# Patient Record
Sex: Female | Born: 1995 | Hispanic: No | Marital: Married | Smoking: Never smoker
Health system: Southern US, Community
[De-identification: ages and names within clinical notes are randomized; demographics above are authoritative.]

## PROBLEM LIST (undated history)

## (undated) ENCOUNTER — Inpatient Hospital Stay (HOSPITAL_COMMUNITY): Payer: Self-pay

## (undated) DIAGNOSIS — G43909 Migraine, unspecified, not intractable, without status migrainosus: Secondary | ICD-10-CM

---

## 2016-03-28 ENCOUNTER — Encounter (HOSPITAL_BASED_OUTPATIENT_CLINIC_OR_DEPARTMENT_OTHER): Payer: Self-pay | Admitting: *Deleted

## 2016-03-28 ENCOUNTER — Emergency Department (HOSPITAL_BASED_OUTPATIENT_CLINIC_OR_DEPARTMENT_OTHER)
Admission: EM | Admit: 2016-03-28 | Discharge: 2016-03-28 | Disposition: A | Discharge status: Home / Self Care | Payer: BLUE CROSS/BLUE SHIELD | Attending: Emergency Medicine | Admitting: Emergency Medicine

## 2016-03-28 ENCOUNTER — Emergency Department (HOSPITAL_BASED_OUTPATIENT_CLINIC_OR_DEPARTMENT_OTHER): Payer: BLUE CROSS/BLUE SHIELD

## 2016-03-28 DIAGNOSIS — R102 Pelvic and perineal pain: Secondary | ICD-10-CM

## 2016-03-28 DIAGNOSIS — Z79899 Other long term (current) drug therapy: Secondary | ICD-10-CM | POA: Insufficient documentation

## 2016-03-28 DIAGNOSIS — R1030 Lower abdominal pain, unspecified: Secondary | ICD-10-CM | POA: Diagnosis present

## 2016-03-28 DIAGNOSIS — N83201 Unspecified ovarian cyst, right side: Secondary | ICD-10-CM | POA: Diagnosis not present

## 2016-03-28 HISTORY — DX: Migraine, unspecified, not intractable, without status migrainosus: G43.909

## 2016-03-28 LAB — WET PREP, GENITAL
CLUE CELLS WET PREP: NONE SEEN
Sperm: NONE SEEN
TRICH WET PREP: NONE SEEN
Yeast Wet Prep HPF POC: NONE SEEN

## 2016-03-28 LAB — CBC WITH DIFFERENTIAL/PLATELET
BASOS ABS: 0 10*3/uL (ref 0.0–0.1)
BASOS PCT: 0 %
Eosinophils Absolute: 0.4 10*3/uL (ref 0.0–0.7)
Eosinophils Relative: 5 %
HEMATOCRIT: 34.6 % — AB (ref 36.0–46.0)
Hemoglobin: 11.8 g/dL — ABNORMAL LOW (ref 12.0–15.0)
LYMPHS PCT: 34 %
Lymphs Abs: 2.5 10*3/uL (ref 0.7–4.0)
MCH: 29.7 pg (ref 26.0–34.0)
MCHC: 34.1 g/dL (ref 30.0–36.0)
MCV: 87.2 fL (ref 78.0–100.0)
MONO ABS: 0.6 10*3/uL (ref 0.1–1.0)
Monocytes Relative: 8 %
NEUTROS ABS: 4 10*3/uL (ref 1.7–7.7)
NEUTROS PCT: 53 %
Platelets: 350 10*3/uL (ref 150–400)
RBC: 3.97 MIL/uL (ref 3.87–5.11)
RDW: 13.7 % (ref 11.5–15.5)
WBC: 7.6 10*3/uL (ref 4.0–10.5)

## 2016-03-28 LAB — URINE MICROSCOPIC-ADD ON

## 2016-03-28 LAB — URINALYSIS, ROUTINE W REFLEX MICROSCOPIC
Bilirubin Urine: NEGATIVE
Glucose, UA: NEGATIVE mg/dL
HGB URINE DIPSTICK: NEGATIVE
Ketones, ur: NEGATIVE mg/dL
Nitrite: NEGATIVE
PROTEIN: NEGATIVE mg/dL
SPECIFIC GRAVITY, URINE: 1.021 (ref 1.005–1.030)
pH: 6.5 (ref 5.0–8.0)

## 2016-03-28 LAB — BASIC METABOLIC PANEL
ANION GAP: 6 (ref 5–15)
BUN: 16 mg/dL (ref 6–20)
CALCIUM: 8.9 mg/dL (ref 8.9–10.3)
CO2: 25 mmol/L (ref 22–32)
Chloride: 107 mmol/L (ref 101–111)
Creatinine, Ser: 0.62 mg/dL (ref 0.44–1.00)
GLUCOSE: 84 mg/dL (ref 65–99)
POTASSIUM: 3.8 mmol/L (ref 3.5–5.1)
SODIUM: 138 mmol/L (ref 135–145)

## 2016-03-28 LAB — PREGNANCY, URINE: PREG TEST UR: NEGATIVE

## 2016-03-28 MED ORDER — KETOROLAC TROMETHAMINE 30 MG/ML IJ SOLN
30.0000 mg | Freq: Once | INTRAMUSCULAR | Status: AC
  Administered 2016-03-28: 30 mg via INTRAVENOUS
  Filled 2016-03-28: qty 1

## 2016-03-28 NOTE — ED Notes (Signed)
Pt given d/c instructions as per chart. Verbalizes understanding. No questions. 

## 2016-03-28 NOTE — ED Provider Notes (Signed)
[facility] Note   CSN: [id number] Arrival date & time: 03/28/16  1025  First Provider Contact:  First MD Initiated Contact with Patient 03/28/16 1045        History   Chief Complaint Chief Complaint  Patient presents with  . Abdominal Pain    HPI Katrina Sweeney is a 20 y.o. female.  Patient presents with abdominal pain. She is a 20 year old female with a history of migraines. She states at 9:00 this morning she had sudden onset of pain across her lower abdomen. There is associated nausea and dry heaves. She denies any urinary symptoms. No back pain. No vaginal bleeding or discharge. Her last menstrual period was July 2. She states she's had a history of bad menstrual cramps but no pain it's been this bad. She denies any known fevers. No change in bowel habits.  She denies being sexually active.    Abdominal Pain   Associated symptoms include nausea. Pertinent negatives include fever, diarrhea, vomiting, frequency, hematuria, headaches and arthralgias.    Past Medical History:  Diagnosis Date  . Migraine     There are no active problems to display for this patient.   History reviewed. No pertinent surgical history.  OB History    No data available       Home Medications    Prior to Admission medications   Medication Sig Start Date End Date Taking? Authorizing Provider  TIZANIDINE HCL PO Take 2 tablets by mouth at bedtime. Does not know dose.   Yes Historical Provider, MD  Topiramate (TOPAMAX PO) Take 2 tablets by mouth every morning.   Yes Historical Provider, MD    Family History No family history on file.  Social History Social History  Substance Use Topics  . Smoking status: Not on file  . Smokeless tobacco: Not on file  . Alcohol use Not on file     Allergies   Review of patient's allergies indicates no known allergies.   Review of Systems Review of Systems  Constitutional: Negative for chills, diaphoresis, fatigue and  fever.  HENT: Negative for congestion, rhinorrhea and sneezing.   Eyes: Negative.   Respiratory: Negative for cough, chest tightness and shortness of breath.   Cardiovascular: Negative for chest pain and leg swelling.  Gastrointestinal: Positive for abdominal pain and nausea. Negative for blood in stool, diarrhea and vomiting.  Genitourinary: Positive for pelvic pain. Negative for difficulty urinating, flank pain, frequency and hematuria.  Musculoskeletal: Negative for arthralgias and back pain.  Skin: Negative for rash.  Neurological: Negative for dizziness, speech difficulty, weakness, numbness and headaches.     Physical Exam Updated Vital Signs BP 100/59 (BP Location: Right Arm)   Pulse 74   Temp 98.2 F (36.8 C) (Oral)   Resp 18   Ht 5' (1.524 m)   Wt 127 lb (57.6 kg)   LMP 03/01/2016   SpO2 100%   BMI 24.80 kg/m   Physical Exam  Constitutional: She is oriented to person, place, and time. She appears well-developed and well-nourished.  HENT:  Head: Normocephalic and atraumatic.  Eyes: Pupils are equal, round, and reactive to light.  Neck: Normal range of motion. Neck supple.  Cardiovascular: Normal rate, regular rhythm and normal heart sounds.   Pulmonary/Chest: Effort normal and breath sounds normal. No respiratory distress. She has no wheezes. She has no rales. She exhibits no tenderness.  Abdominal: Soft. Bowel sounds are normal. There is tenderness (moderate TTP suprapubic area and across lower abdomen bilaterally). There  is no rebound and no guarding.  Genitourinary: Uterus normal. Cervix exhibits no motion tenderness and no friability. Right adnexum displays tenderness. Vaginal discharge (slight yellow discharge) found.  Musculoskeletal: Normal range of motion. She exhibits no edema.  Lymphadenopathy:    She has no cervical adenopathy.  Neurological: She is alert and oriented to person, place, and time.  Skin: Skin is warm and dry. No rash noted.  Psychiatric: She  has a normal mood and affect.     ED Treatments / Results  Labs (all labs ordered are listed, but only abnormal results are displayed) Labs Reviewed  WET PREP, GENITAL - Abnormal; Notable for the following:       Result Value   WBC, Wet Prep HPF POC MANY (*)    All other components within normal limits  URINALYSIS, ROUTINE W REFLEX MICROSCOPIC (NOT AT [facility]) - Abnormal; Notable for the following:    APPearance CLOUDY (*)    Leukocytes, UA SMALL (*)    All other components within normal limits  CBC WITH DIFFERENTIAL/PLATELET - Abnormal; Notable for the following:    Hemoglobin 11.8 (*)    HCT 34.6 (*)    All other components within normal limits  URINE MICROSCOPIC-ADD ON - Abnormal; Notable for the following:    Squamous Epithelial / LPF 0-5 (*)    Bacteria, UA MANY (*)    All other components within normal limits  PREGNANCY, URINE  BASIC METABOLIC PANEL  GC/CHLAMYDIA PROBE AMP ([place]) NOT AT [facility]    EKG  EKG Interpretation None       Radiology No results found.  Procedures Procedures (including critical care time)  Medications Ordered in ED Medications  ketorolac (TORADOL) 30 MG/ML injection 30 mg (30 mg Intravenous Given 03/28/16 1134)     Initial Impression / Assessment and Plan / ED Course  I have reviewed the triage vital signs and the nursing notes.  Pertinent labs & imaging results that were available during my care of the patient were reviewed by me and considered in my medical decision making (see chart for details).  Clinical Course    Patient presents with lower abdominal pain. On exam, it seems to be more pelvic in nature. She has no strong evidence of infection. She is currently awaiting a pelvic ultrasound.  Dr. Adela Lank to take over care.  Final Clinical Impressions(s) / ED Diagnoses   Final diagnoses:  Pelvic pain in female    New Prescriptions New Prescriptions   No medications on file     Rolan Bucco, MD 03/28/16 1453

## 2016-03-28 NOTE — ED Triage Notes (Signed)
Patient states she developed diffuse abdominal pain this morning around 0900.  Describes pain as constant pressure and sharp, and is associated with nausea and dry heaves.

## 2016-03-28 NOTE — ED Notes (Signed)
Pt placed on automatic vital signs.

## 2016-03-30 LAB — GC/CHLAMYDIA PROBE AMP (~~LOC~~) NOT AT ARMC
CHLAMYDIA, DNA PROBE: NEGATIVE
Neisseria Gonorrhea: NEGATIVE

## 2016-04-15 ENCOUNTER — Ambulatory Visit (INDEPENDENT_AMBULATORY_CARE_PROVIDER_SITE_OTHER): Payer: BLUE CROSS/BLUE SHIELD | Admitting: Obstetrics & Gynecology

## 2016-04-15 ENCOUNTER — Encounter: Payer: Self-pay | Admitting: Obstetrics & Gynecology

## 2016-04-15 VITALS — BP 96/61 | HR 72 | Ht 61.0 in | Wt 125.0 lb

## 2016-04-15 DIAGNOSIS — N83209 Unspecified ovarian cyst, unspecified side: Secondary | ICD-10-CM | POA: Diagnosis not present

## 2016-04-15 DIAGNOSIS — R102 Pelvic and perineal pain: Secondary | ICD-10-CM | POA: Diagnosis not present

## 2016-04-15 MED ORDER — IBUPROFEN 800 MG PO TABS: 800.0000 mg | ORAL_TABLET | Freq: Three times a day (TID) | ORAL | 1 refills | Status: DC | PRN

## 2016-04-15 NOTE — Patient Instructions (Signed)
Ovarian Cyst An ovarian cyst is a fluid-filled sac that forms on an ovary. The ovaries are small organs that produce eggs in women. Various types of cysts can form on the ovaries. Most are not cancerous. Many do not cause problems, and they often go away on their own. Some may cause symptoms and require treatment. Common types of ovarian cysts include:  Functional cysts--These cysts may occur every month during the menstrual cycle. This is normal. The cysts usually go away with the next menstrual cycle if the woman does not get pregnant. Usually, there are no symptoms with a functional cyst.  Endometrioma cysts--These cysts form from the tissue that lines the uterus. They are also called "chocolate cysts" because they become filled with blood that turns brown. This type of cyst can cause pain in the lower abdomen during intercourse and with your menstrual period.  Cystadenoma cysts--This type develops from the cells on the outside of the ovary. These cysts can get very big and cause lower abdomen pain and pain with intercourse. This type of cyst can twist on itself, cut off its blood supply, and cause severe pain. It can also easily rupture and cause a lot of pain.  Dermoid cysts--This type of cyst is sometimes found in both ovaries. These cysts may contain different kinds of body tissue, such as skin, teeth, hair, or cartilage. They usually do not cause symptoms unless they get very big.  Theca lutein cysts--These cysts occur when too much of a certain hormone (human chorionic gonadotropin) is produced and overstimulates the ovaries to produce an egg. This is most common after procedures used to assist with the conception of a baby (in vitro fertilization). CAUSES   Fertility drugs can cause a condition in which multiple large cysts are formed on the ovaries. This is called ovarian hyperstimulation syndrome.  A condition called polycystic ovary syndrome can cause hormonal imbalances that can lead to  nonfunctional ovarian cysts. SIGNS AND SYMPTOMS  Many ovarian cysts do not cause symptoms. If symptoms are present, they may include:  Pelvic pain or pressure.  Pain in the lower abdomen.  Pain during sexual intercourse.  Increasing girth (swelling) of the abdomen.  Abnormal menstrual periods.  Increasing pain with menstrual periods.  Stopping having menstrual periods without being pregnant. DIAGNOSIS  These cysts are commonly found during a routine or annual pelvic exam. Tests may be ordered to find out more about the cyst. These tests may include:  Ultrasound.  X-ray of the pelvis.  CT scan.  MRI.  Blood tests. TREATMENT  Many ovarian cysts go away on their own without treatment. Your health care provider may want to check your cyst regularly for 2-3 months to see if it changes. For women in menopause, it is particularly important to monitor a cyst closely because of the higher rate of ovarian cancer in menopausal women. When treatment is needed, it may include any of the following:  A procedure to drain the cyst (aspiration). This may be done using a long needle and ultrasound. It can also be done through a laparoscopic procedure. This involves using a thin, lighted tube with a tiny camera on the end (laparoscope) inserted through a small incision.  Surgery to remove the whole cyst. This may be done using laparoscopic surgery or an open surgery involving a larger incision in the lower abdomen.  Hormone treatment or birth control pills. These methods are sometimes used to help dissolve a cyst. HOME CARE INSTRUCTIONS   Only take over-the-counter   or prescription medicines as directed by your health care provider.  Follow up with your health care provider as directed.  Get regular pelvic exams and Pap tests. SEEK MEDICAL CARE IF:   Your periods are late, irregular, or painful, or they stop.  Your pelvic pain or abdominal pain does not go away.  Your abdomen becomes  larger or swollen.  You have pressure on your bladder or trouble emptying your bladder completely.  You have pain during sexual intercourse.  You have feelings of fullness, pressure, or discomfort in your stomach.  You lose weight for no apparent reason.  You feel generally ill.  You become constipated.  You lose your appetite.  You develop acne.  You have an increase in body and facial hair.  You are gaining weight, without changing your exercise and eating habits.  You think you are pregnant. SEEK IMMEDIATE MEDICAL CARE IF:   You have increasing abdominal pain.  You feel sick to your stomach (nauseous), and you throw up (vomit).  You develop a fever that comes on suddenly.  You have abdominal pain during a bowel movement.  Your menstrual periods become heavier than usual. MAKE SURE YOU:  Understand these instructions.  Will watch your condition.  Will get help right away if you are not doing well or get worse.   This information is not intended to replace advice given to you by your health care provider. Make sure you discuss any questions you have with your health care provider.   Document Released: 08/17/2005 Document Revised: 08/22/2013 Document Reviewed: 04/24/2013 Elsevier Interactive Patient Education 2016 Elsevier Inc. Dysmenorrhea Menstrual cramps (dysmenorrhea) are caused by the muscles of the uterus tightening (contracting) during a menstrual period. For some women, this discomfort is merely bothersome. For others, dysmenorrhea can be severe enough to interfere with everyday activities for a few days each month. Primary dysmenorrhea is menstrual cramps that last a couple of days when you start having menstrual periods or soon after. This often begins after a teenager starts having her period. As a woman gets older or has a baby, the cramps will usually lessen or disappear. Secondary dysmenorrhea begins later in life, lasts longer, and the pain may be  stronger than primary dysmenorrhea. The pain may start before the period and last a few days after the period.  CAUSES  Dysmenorrhea is usually caused by an underlying problem, such as:  The tissue lining the uterus grows outside of the uterus in other areas of the body (endometriosis).  The endometrial tissue, which normally lines the uterus, is found in or grows into the muscular walls of the uterus (adenomyosis).  The pelvic blood vessels are engorged with blood just before the menstrual period (pelvic congestive syndrome).  Overgrowth of cells (polyps) in the lining of the uterus or cervix.  Falling down of the uterus (prolapse) because of loose or stretched ligaments.  Depression.  Bladder problems, infection, or inflammation.  Problems with the intestine, a tumor, or irritable bowel syndrome.  Cancer of the female organs or bladder.  A severely tipped uterus.  A very tight opening or closed cervix.  Noncancerous tumors of the uterus (fibroids).  Pelvic inflammatory disease (PID).  Pelvic scarring (adhesions) from a previous surgery.  Ovarian cyst.  An intrauterine device (IUD) used for birth control. RISK FACTORS You may be at greater risk of dysmenorrhea if:  You are younger than age 22.  You started puberty early.  You have irregular or heavy bleeding.  You have  never given birth.  You have a family history of this problem.  You are a smoker. SIGNS AND SYMPTOMS   Cramping or throbbing pain in your lower abdomen.  Headaches.  Lower back pain.  Nausea or vomiting.  Diarrhea.  Sweating or dizziness.  Loose stools. DIAGNOSIS  A diagnosis is based on your history, symptoms, physical exam, diagnostic tests, or procedures. Diagnostic tests or procedures may include:  Blood tests.  Ultrasonography.  An examination of the lining of the uterus (dilation and curettage, D&C).  An examination inside your abdomen or pelvis with a scope  (laparoscopy).  X-rays.  CT scan.  MRI.  An examination inside the bladder with a scope (cystoscopy).  An examination inside the intestine or stomach with a scope (colonoscopy, gastroscopy). TREATMENT  Treatment depends on the cause of the dysmenorrhea. Treatment may include:  Pain medicine prescribed by your health care provider.  Birth control pills or an IUD with progesterone hormone in it.  Hormone replacement therapy.  Nonsteroidal anti-inflammatory drugs (NSAIDs). These may help stop the production of prostaglandins.  Surgery to remove adhesions, endometriosis, ovarian cyst, or fibroids.  Removal of the uterus (hysterectomy).  Progesterone shots to stop the menstrual period.  Cutting the nerves on the sacrum that go to the female organs (presacral neurectomy).  Electric current to the sacral nerves (sacral nerve stimulation).  Antidepressant medicine.  Psychiatric therapy, counseling, or group therapy.  Exercise and physical therapy.  Meditation and yoga therapy.  Acupuncture. HOME CARE INSTRUCTIONS   Only take over-the-counter or prescription medicines as directed by your health care provider.  Place a heating pad or hot water bottle on your lower back or abdomen. Do not sleep with the heating pad.  Use aerobic exercises, walking, swimming, biking, and other exercises to help lessen the cramping.  Massage to the lower back or abdomen may help.  Stop smoking.  Avoid alcohol and caffeine. SEEK MEDICAL CARE IF:   Your pain does not get better with medicine.  You have pain with sexual intercourse.  Your pain increases and is not controlled with medicines.  You have abnormal vaginal bleeding with your period.  You develop nausea or vomiting with your period that is not controlled with medicine. SEEK IMMEDIATE MEDICAL CARE IF:  You pass out.    This information is not intended to replace advice given to you by your health care provider. Make sure  you discuss any questions you have with your health care provider.   Document Released: 08/17/2005 Document Revised: 04/19/2013 Document Reviewed: 02/02/2013 Elsevier Interactive Patient Education [employer]2016 Elsevier Inc.

## 2016-04-15 NOTE — Progress Notes (Signed)
History:  20 y.o. G0P0000 here today for pain in her pelvis.  Pt reports significant dysmenorrhea but this was different. She reports that the pain has persisted but, it has been mild. She has been taking Advil with some relief of the pain. Never been sexually active. Pt denies discharge.  Pt reports menses q 1-2 months.   The following portions of the patient's history were reviewed and updated as appropriate: allergies, current medications, past family history, past medical history, past social history, past surgical history and problem list.  Meds: all for migraines.see med list   Review of Systems:  Pertinent items are noted in HPI.  Objective:  Physical Exam Blood pressure 96/61, pulse 72, height 5\' 1"  (1.549 m), weight 125 lb (56.7 kg), last menstrual period 03/06/2016. Gen: NAD Abd: Soft, nontender and nondistended Pelvic: Normal appearing external genitalia; normal appearing vaginal mucosa and cervix.  Normal discharge.  Small uterus, no other palpable masses, no uterine or adnexal tenderness  Labs and Imaging [place] Pelvis Complete  Result Date: 03/28/2016 CLINICAL DATA:  Pelvic pain.  No chance of pregnancy. EXAM: TRANSABDOMINAL ULTRASOUND OF PELVIS DOPPLER ULTRASOUND OF OVARIES TECHNIQUE: Transabdominal ultrasound examination of the pelvis was performed including evaluation of the uterus, ovaries, adnexal regions, and pelvic cul-de-sac. Color and duplex Doppler ultrasound was utilized to evaluate blood flow to the ovaries. COMPARISON:  None. FINDINGS: Uterus Measurements: 7.6 x 3.4 x 4.8 cm. No fibroids or other mass visualized. Endometrium Thickness: 18 mm. No focal abnormality visualized. Right ovary Measurements: 2.7 x 1.8 x 2.9 cm. Normal appearance/no adnexal mass. Left ovary Measurements: 7.5 x 3.5 x 3.8 cm. It contains a complex cystic structure with low level internal echoes measuring 3.2 x 2.4 x 2.5 cm. Pulsed Doppler evaluation demonstrates normal low-resistance arterial and  venous waveforms in both ovaries. IMPRESSION: Normal appearance of the right ovary and uterus. Probable 3.2 cm hemorrhagic cyst on the left ovary. Follow-up in 6-8 weeks may be considered to assure resolution. Electronically Signed   By: Ted Mcalpineobrinka  Dimitrova M.D.   On: 03/28/2016 16:08  [place] Art/ven Flow Abd Pelv Doppler  Result Date: 03/28/2016 CLINICAL DATA:  Pelvic pain.  No chance of pregnancy. EXAM: TRANSABDOMINAL ULTRASOUND OF PELVIS DOPPLER ULTRASOUND OF OVARIES TECHNIQUE: Transabdominal ultrasound examination of the pelvis was performed including evaluation of the uterus, ovaries, adnexal regions, and pelvic cul-de-sac. Color and duplex Doppler ultrasound was utilized to evaluate blood flow to the ovaries. COMPARISON:  None. FINDINGS: Uterus Measurements: 7.6 x 3.4 x 4.8 cm. No fibroids or other mass visualized. Endometrium Thickness: 18 mm. No focal abnormality visualized. Right ovary Measurements: 2.7 x 1.8 x 2.9 cm. Normal appearance/no adnexal mass. Left ovary Measurements: 7.5 x 3.5 x 3.8 cm. It contains a complex cystic structure with low level internal echoes measuring 3.2 x 2.4 x 2.5 cm. Pulsed Doppler evaluation demonstrates normal low-resistance arterial and venous waveforms in both ovaries. IMPRESSION: Normal appearance of the right ovary and uterus. Probable 3.2 cm hemorrhagic cyst on the left ovary. Follow-up in 6-8 weeks may be considered to assure resolution. Electronically Signed   By: Ted Mcalpineobrinka  Dimitrova M.D.   On: 03/28/2016 16:08   Assessment & Plan:  Dysmenorrhea  Primary- pt wants to use NSAIDS for now. Reviewed how to take.  Ov cyst- suspect hemorrhagic cyst.  Rec f/u son is 3 months   Pt to f/u in 3 months or sooner prn Pt is starting college and will f/u for her sono and appt upon here return during the [place]  break  Katrina Sweeney, M.D., [facility]AC[facility]

## 2016-12-07 IMAGING — US US PELVIS COMPLETE
1 series · 14 of 25 positions shown · non-contrast
Comparison: None.

CLINICAL DATA: Pelvic pain.  No chance of pregnancy.

EXAM:
TRANSABDOMINAL ULTRASOUND OF PELVIS
DOPPLER ULTRASOUND OF OVARIES
TECHNIQUE: Transabdominal ultrasound examination of the pelvis was performed
including evaluation of the uterus, ovaries, adnexal regions, and
pelvic cul-de-sac.
Color and duplex Doppler ultrasound was utilized to evaluate blood
flow to the ovaries.

[Series 1: us pelvis complete · 0.22mm/px · 14 of 56 slices shown]
[im 1/56]
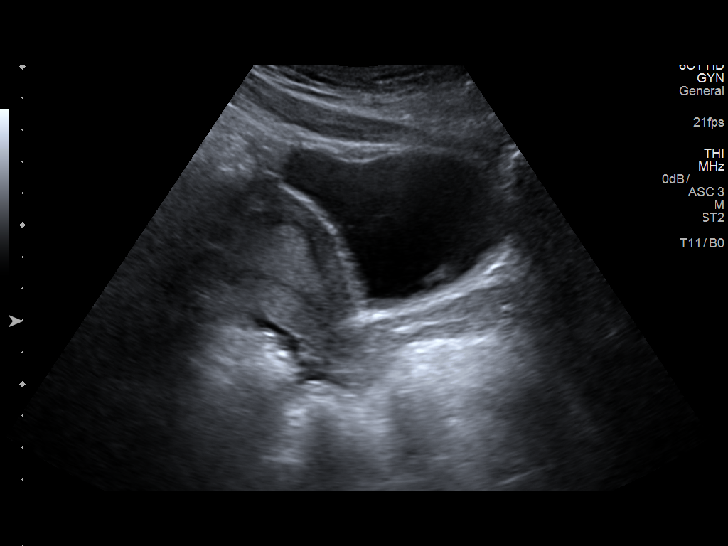
[im 5/56]
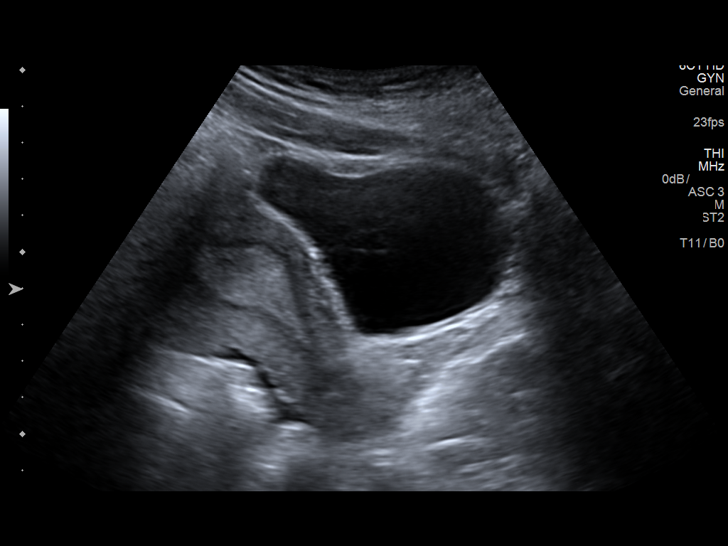
[im 10/56]
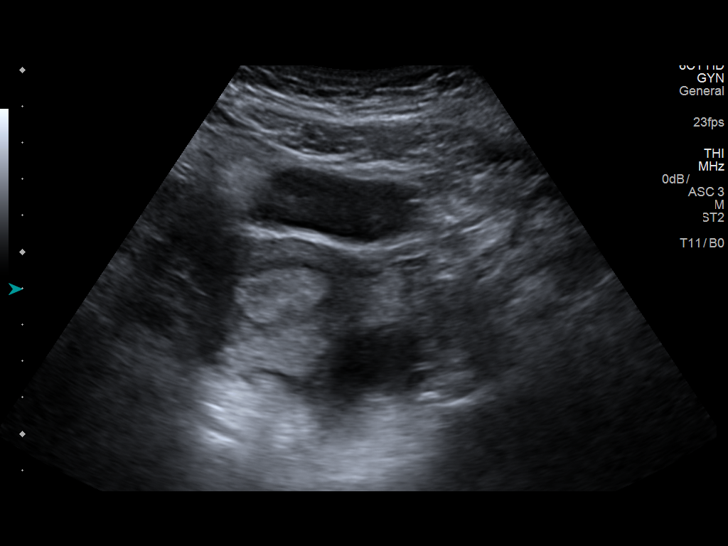
[im 14/56]
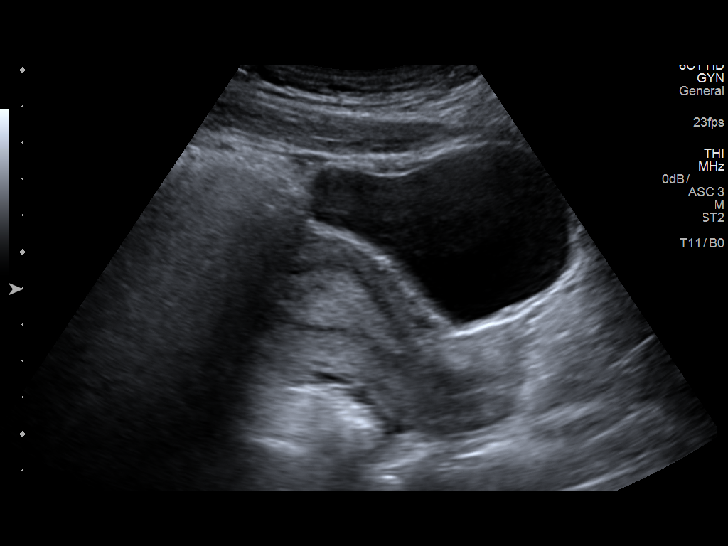
[im 19/56]
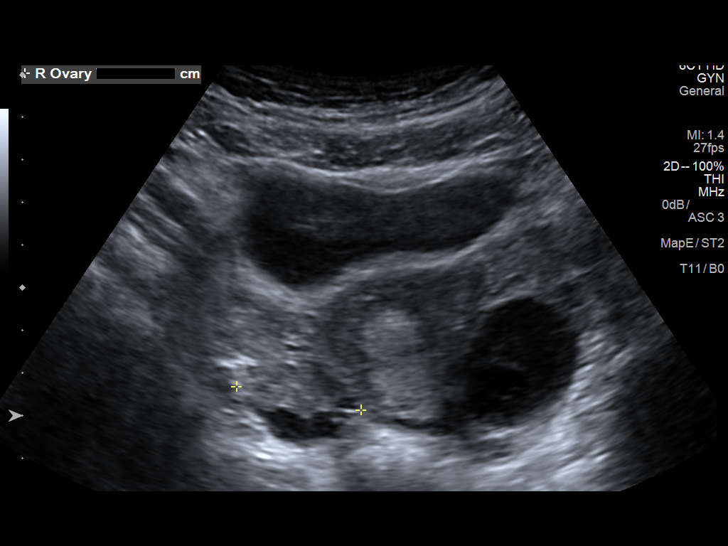
[im 21/56]
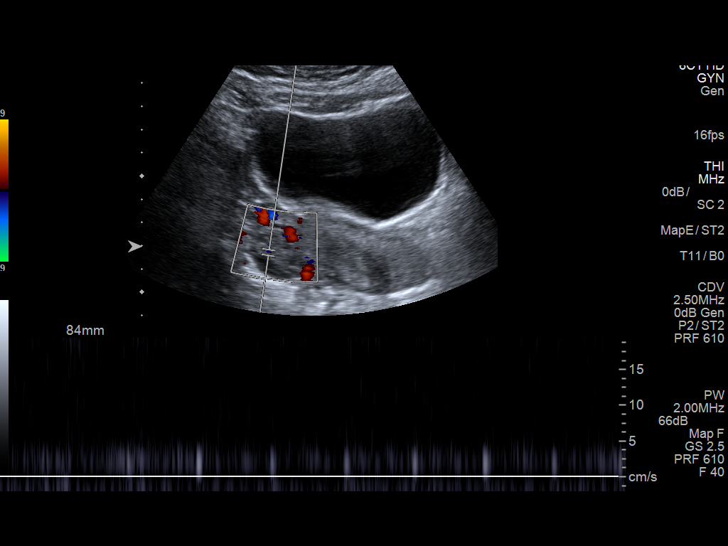
[im 26/56]
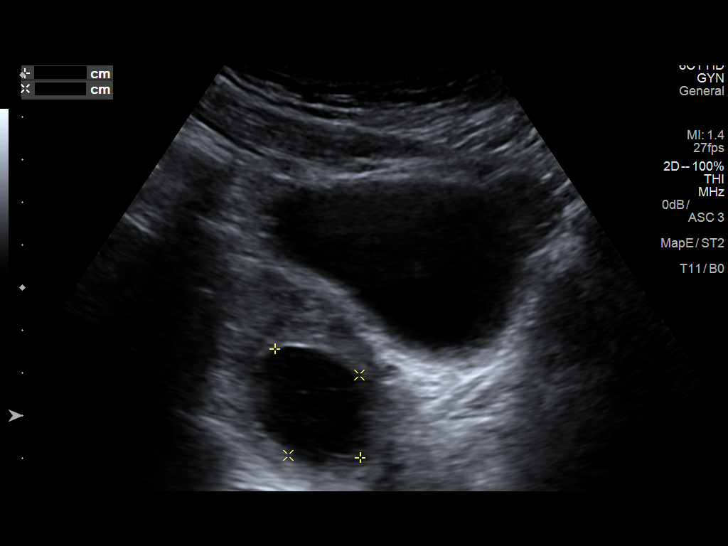
[im 30/56]
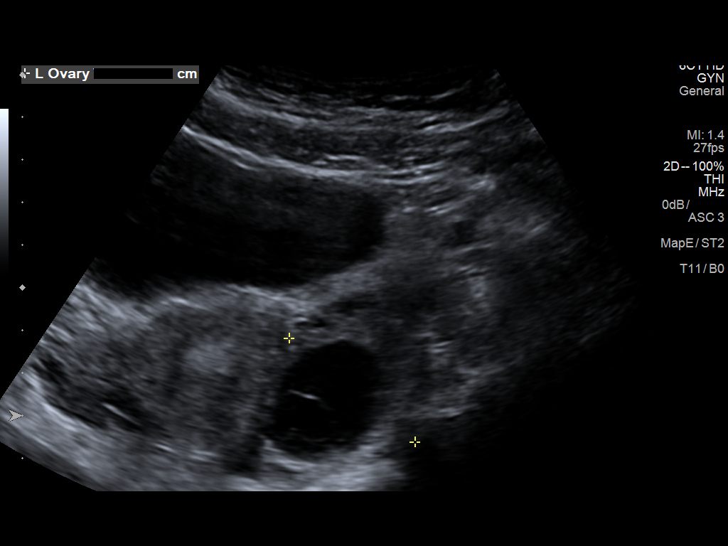
[im 35/56]
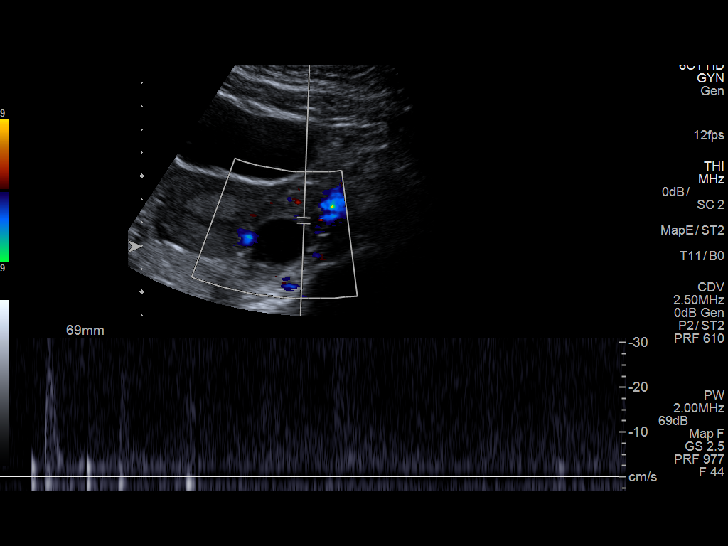
[im 37/56]
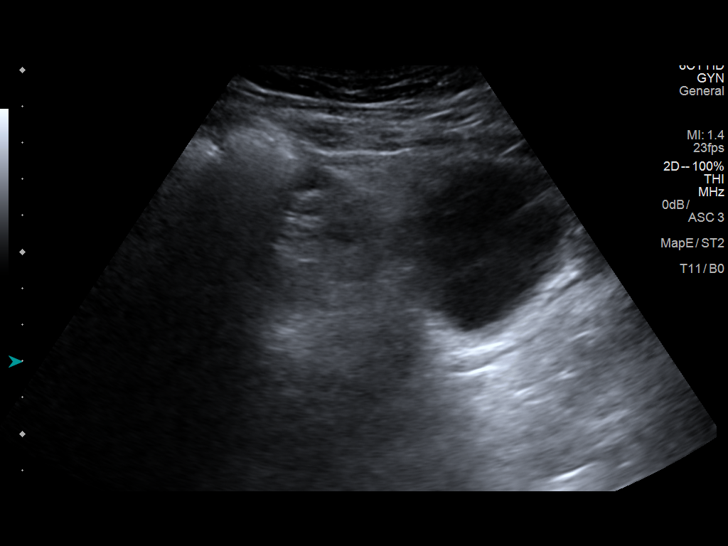
[im 42/56]
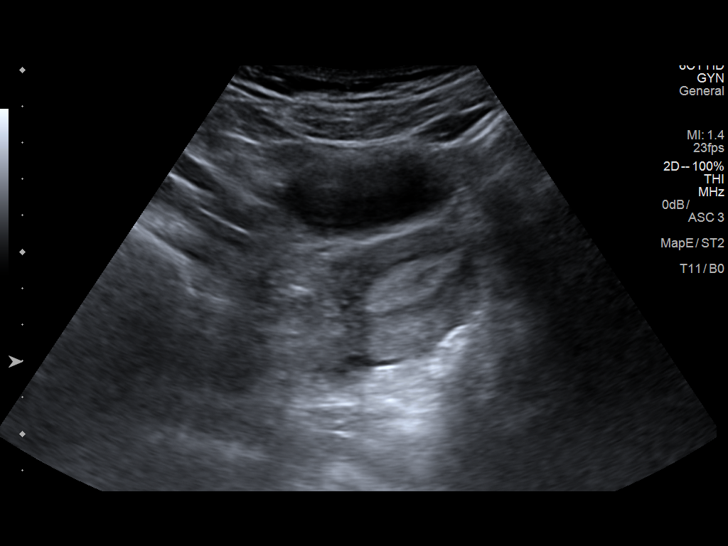
[im 46/56]
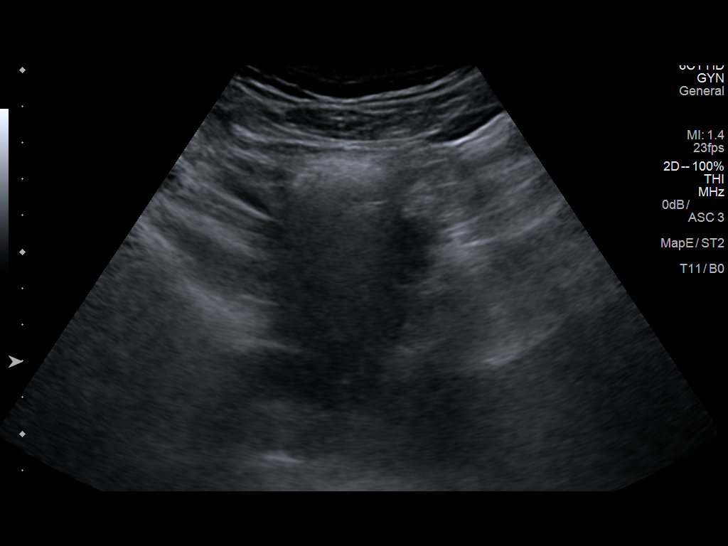
[im 51/56]
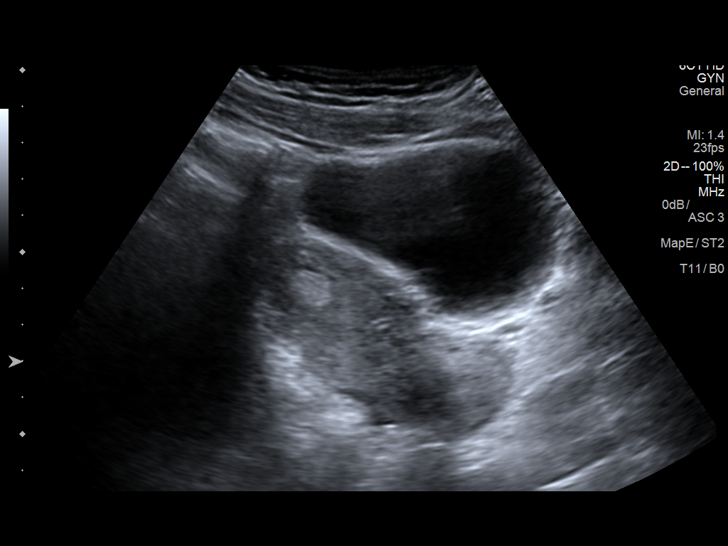
[im 56/56]
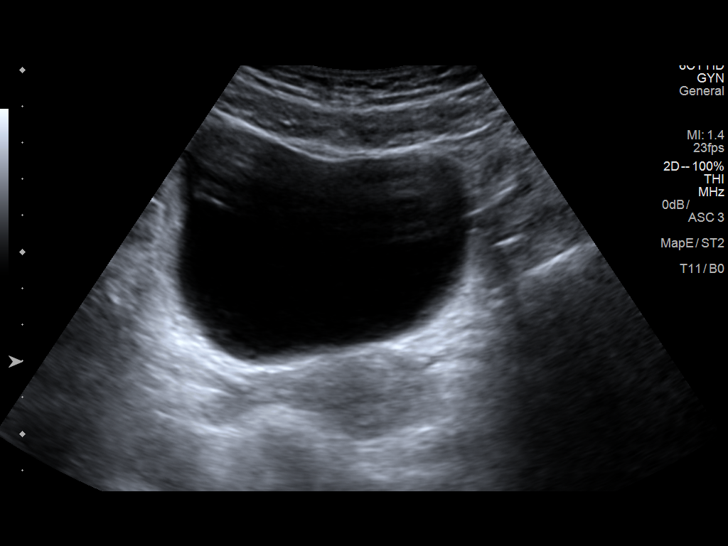

[14 of 25 positions shown; findings below may reference images not displayed]

FINDINGS: Uterus

Measurements: 7.6 x 3.4 x 4.8 cm. No fibroids or other mass
visualized.

Endometrium

Thickness: 18 mm. No focal abnormality visualized.

Right ovary

Measurements: 2.7 x 1.8 x 2.9 cm. Normal appearance/no adnexal mass.

Left ovary

Measurements: 7.5 x 3.5 x 3.8 cm.. It contains a complex cystic
structure with low level internal echoes measuring 3.2 x 2.4 x
cm.

Pulsed Doppler evaluation demonstrates normal low-resistance
arterial and venous waveforms in both ovaries.
IMPRESSION: Normal appearance of the right ovary and uterus.

Probable 3.2 cm hemorrhagic cyst on the left ovary. Follow-up in 6-8
weeks may be considered to assure resolution.

## 2023-08-26 ENCOUNTER — Inpatient Hospital Stay (HOSPITAL_BASED_OUTPATIENT_CLINIC_OR_DEPARTMENT_OTHER)
Admission: EM | Admit: 2023-08-26 | Discharge: 2023-08-26 | Disposition: A | Discharge status: Home / Self Care | Payer: Managed Care, Other (non HMO) | Attending: Family Medicine | Admitting: Family Medicine

## 2023-08-26 ENCOUNTER — Encounter (HOSPITAL_BASED_OUTPATIENT_CLINIC_OR_DEPARTMENT_OTHER): Payer: Self-pay

## 2023-08-26 ENCOUNTER — Other Ambulatory Visit: Payer: Self-pay

## 2023-08-26 DIAGNOSIS — R879 Unspecified abnormal finding in specimens from female genital organs: Secondary | ICD-10-CM | POA: Diagnosis present

## 2023-08-26 DIAGNOSIS — Z0371 Encounter for suspected problem with amniotic cavity and membrane ruled out: Secondary | ICD-10-CM

## 2023-08-26 DIAGNOSIS — Z3A24 24 weeks gestation of pregnancy: Secondary | ICD-10-CM | POA: Diagnosis not present

## 2023-08-26 DIAGNOSIS — O26892 Other specified pregnancy related conditions, second trimester: Secondary | ICD-10-CM | POA: Diagnosis not present

## 2023-08-26 LAB — URINALYSIS, ROUTINE W REFLEX MICROSCOPIC
Bilirubin Urine: NEGATIVE
Glucose, UA: NEGATIVE mg/dL
Hgb urine dipstick: NEGATIVE
Ketones, ur: 20 mg/dL — AB
Leukocytes,Ua: NEGATIVE
Nitrite: NEGATIVE
Protein, ur: NEGATIVE mg/dL
Specific Gravity, Urine: 1.028 (ref 1.005–1.030)
pH: 5 (ref 5.0–8.0)

## 2023-08-26 LAB — WET PREP, GENITAL
Clue Cells Wet Prep HPF POC: NONE SEEN
Sperm: NONE SEEN
Trich, Wet Prep: NONE SEEN
WBC, Wet Prep HPF POC: <10 (ref <10)
Yeast Wet Prep HPF POC: NONE SEEN

## 2023-08-26 LAB — CBG MONITORING, ED: Glucose-Capillary: 83 mg/dL (ref 70–99)

## 2023-08-26 LAB — POCT FERN TEST: POCT Fern Test: NEGATIVE

## 2023-08-26 LAB — RUPTURE OF MEMBRANE (ROM)PLUS: Rom Plus: NEGATIVE

## 2023-08-26 MED ORDER — FLUCONAZOLE 150 MG PO TABS: 150.0000 mg | ORAL_TABLET | Freq: Once | ORAL | 0 refills | Status: AC

## 2023-08-26 NOTE — ED Triage Notes (Signed)
The patient stated she woke up last night with a gush of fluid from her vagina she stated that she is still having a small amount of fluid coming out. She stated it might have been urine. She denied any pain.

## 2023-08-26 NOTE — ED Notes (Signed)
ED Provider at bedside. 

## 2023-08-26 NOTE — Progress Notes (Signed)
PT at [facility]. C/o ?PROM. C/o gush of fluid last night and then more leaking today. Spec exam done. Visually closed. No pooling . FHR doppler 171. Dr Alvester Morin updated . Requested transfer to [facility]U

## 2023-08-26 NOTE — ED Notes (Signed)
A Harris PA C spoke with OB Rapid response about the Pt. And will be sending the Pt. To MAU Dr. Alvester Morin service.

## 2023-08-26 NOTE — ED Notes (Signed)
Crist Fat test is a send out so the Pt. Will have done when she goes to [facility]U at [facility].

## 2023-08-26 NOTE — Discharge Instructions (Signed)
Your examine today revealed that your membranes are NOT broken. There is a low suspicion that you may have a yeast infection. A prescription will be sent for Diflucan 150 mg 1 time dose to treat the mild yeast infection. Please also make sure you stay well-hydrated.

## 2023-08-26 NOTE — MAU Note (Signed)
Katrina Sweeney is a 27 y.o. at [redacted]w[redacted]d here in MAU reporting: patient arrived via POV from [facility] ED complaining of having a gush of fluid last night that was clear. Denies any other gushes but says she feels like she urinates on herself when she coughs. Pt is wearing a panty liner and says it is dry. Denies any CTXs or VB. +FM  LMP: n/a Onset of complaint: last night  Pain score: 0 Vitals:   08/26/23 1347 08/26/23 1445  BP: (!) 114/49 99/61  Pulse: 99 87  Resp: 18 16  Temp: 98.7 F (37.1 C)   SpO2: 95% 97%     FHT:155 Lab orders placed from triage:  Crist Fat

## 2023-08-26 NOTE — MAU Provider Note (Signed)
History     CSN: [id number]  Arrival date and time: 08/26/23 1329   Event Date/Time   First Provider Initiated Contact with Patient 08/26/23 1652      Chief Complaint  Patient presents with   Vaginal Discharge   HPI Ms. Katrina Sweeney is a 27 y.o. year old G88P1001 female at [redacted]w[redacted]d weeks gestation who was sent to MAU from [facility] for fern testing and further evaluation. She presented to [facility] with complaints of a "gush" of fluid last night. She describes the fluid as clear and it "soaked through underwear." She states, "I thought I just peed on myself, but when I called by OB office today they advised me to be seen." She has not had any leaking since it happened last night. She does report having incontinence with laughing and/or coughing. She wears pantiliners everyday; current one is dry. She has not complications in this pregnancy with the exception of "normal pelvic girdle pain." She receives [facility] with [facility]; next appt is end of January. Her last OB appt was 2 wks ago.     OB History     Gravida  2   Para  1   Term  1   Preterm  0   AB  0   Living  1      SAB  0   IAB  0   Ectopic  0   Multiple  0   Live Births  1           Past Medical History:  Diagnosis Date   Migraine     History reviewed. No pertinent surgical history.  Family History  Problem Relation Age of Onset   Hypertension Father    Diabetes Father    Diabetes Mother     Social History   Tobacco Use   Smoking status: Never   Smokeless tobacco: Never  Vaping Use   Vaping status: Never Used  Substance Use Topics   Alcohol use: No   Drug use: No    Allergies: No Known Allergies  Medications Prior to Admission  Medication Sig Dispense Refill Last Dose/Taking   Prenatal Vit-Fe Fumarate-FA (PRENATAL MULTIVITAMIN) TABS tablet Take 1 tablet by mouth daily at 12 noon.   08/26/2023   fluticasone (FLONASE) 50 MCG/ACT nasal spray 1 spray by Each Nare route daily.  for 7 days      ibuprofen (ADVIL,MOTRIN) 200 MG tablet Take as needed for headaches.      ibuprofen (ADVIL,MOTRIN) 800 MG tablet Take 1 tablet (800 mg total) by mouth every 8 (eight) hours as needed for moderate pain or cramping. 60 tablet 1    tiZANidine (ZANAFLEX) 4 MG tablet 2 po QHS      venlafaxine XR (EFFEXOR-XR) 75 MG 24 hr capsule   0     Review of Systems  Constitutional: Negative.   HENT: Negative.    Eyes: Negative.   Respiratory: Negative.    Cardiovascular: Negative.   Gastrointestinal: Negative.   Endocrine: Negative.   Genitourinary:  Positive for vaginal discharge.       (+) FM  Musculoskeletal: Negative.   Skin: Negative.   Allergic/Immunologic: Negative.   Neurological: Negative.   Hematological: Negative.   Psychiatric/Behavioral: Negative.     Physical Exam   Blood pressure (!) 100/58, pulse 91, temperature 98.5 F (36.9 C), temperature source Oral, resp. rate 15, height 5' (1.524 m), weight 59 kg, SpO2 97%.  Physical Exam Vitals and nursing note reviewed. Exam  conducted with a chaperone present.  Constitutional:      Appearance: Normal appearance. She is normal weight.  Cardiovascular:     Rate and Rhythm: Normal rate.  Pulmonary:     Effort: Pulmonary effort is normal.  Abdominal:     Palpations: Abdomen is soft.  Genitourinary:    General: Normal vulva.     Comments: Pelvic exam: External genitalia normal, SE: vaginal walls pink and well rugated, cervix is smooth, pink, no lesions, small amt of thin, watery, white vaginal d/c leaking from introitus and in vaginal vault -- cervix visually closed. Musculoskeletal:        General: Normal range of motion.  Skin:    General: Skin is warm and dry.  Neurological:     Mental Status: She is alert and oriented to person, place, and time.  Psychiatric:        Mood and Affect: Mood normal.        Behavior: Behavior normal.        Thought Content: Thought content normal.        Judgment: Judgment normal.     REACTIVE NST - FHR: 145 bpm / moderate variability / accels present / decels absent / TOCO: none  MAU Course  Procedures  MDM Fern Slide ROM Plus Repeat Speculum Exam Other labs from [facility] reviewed   Results for orders placed or performed during the hospital encounter of 08/26/23 (from the past 24 hours)  Wet prep, genital     Status: None   Collection Time: 08/26/23  2:26 PM   Specimen: Cervix  Result Value Ref Range   Yeast Wet Prep HPF POC NONE SEEN NONE SEEN   Trich, Wet Prep NONE SEEN NONE SEEN   Clue Cells Wet Prep HPF POC NONE SEEN NONE SEEN   WBC, Wet Prep HPF POC <10 <10   Sperm NONE SEEN   POC CBG, ED     Status: None   Collection Time: 08/26/23  2:55 PM  Result Value Ref Range   Glucose-Capillary 83 70 - 99 mg/dL  POCT fern test     Status: Normal   Collection Time: 08/26/23  4:30 PM  Result Value Ref Range   POCT Fern Test Negative = intact amniotic membranes   Rupture of Membrane (ROM) Plus     Status: None   Collection Time: 08/26/23  4:44 PM  Result Value Ref Range   Rom Plus NEGATIVE   Urinalysis, Routine w reflex microscopic -Urine, Clean Catch     Status: Abnormal   Collection Time: 08/26/23  5:53 PM  Result Value Ref Range   Color, Urine YELLOW YELLOW   APPearance HAZY (A) CLEAR   Specific Gravity, Urine 1.028 1.005 - 1.030   pH 5.0 5.0 - 8.0   Glucose, UA NEGATIVE NEGATIVE mg/dL   Hgb urine dipstick NEGATIVE NEGATIVE   Bilirubin Urine NEGATIVE NEGATIVE   Ketones, ur 20 (A) NEGATIVE mg/dL   Protein, ur NEGATIVE NEGATIVE mg/dL   Nitrite NEGATIVE NEGATIVE   Leukocytes,Ua NEGATIVE NEGATIVE    Assessment and Plan  1. No leakage of amniotic fluid into vagina (Primary) - Reassurance given that membranes are not ruptured and increased watery d/c is more than likely from the start of a yeast infection.  2. Vaginal discharge during pregnancy in second trimester - prescription for: Diflucan 150 mg po x 1 dose - Advised to stay well-hydrated  3. [redacted]  weeks gestation of pregnancy   - Discharge patient - Keep scheduled appt  with [facility] at the end of January - Patient verbalized an understanding of the plan of care and agrees.   Raelyn Mora, CNM 08/26/2023, 4:52 PM

## 2023-08-26 NOTE — ED Provider Notes (Signed)
[facility] Provider Note   CSN: [id number] Arrival date & time: 08/26/23  1329     History  Chief Complaint  Patient presents with   Vaginal Discharge    Katrina Sweeney is a 27 y.o. female female who presents emergency department for fluid from vagina.  She is [redacted] weeks pregnant.  She is G2P1001.  She has gestational diabetes and had it with her last pregnancy.  Patient reports that she awoke from sleep this morning with a gush of fluid from her vagina.  She is not having any pain or bleeding.  She does have occasional urinary incontinence but has not lost control of her bladder in the past.  She reports that when she stood up there was fluid leaking down her legs.  She denies any vaginal irritation.  She has been been receiving regular natal care in [facility] where she is currently living.  She called her obstetrician this morning who referred her to the emergency department.  She has not had any other vaginal inserts or use of boric acid and has no other complaints at this time.   Vaginal Discharge      Home Medications Prior to Admission medications   Medication Sig Start Date End Date Taking? Authorizing Provider  fluticasone (FLONASE) 50 MCG/ACT nasal spray 1 spray by Each Nare route daily. for 7 days 02/04/15   [provider]  ibuprofen (ADVIL,MOTRIN) 200 MG tablet Take as needed for headaches.    [provider]  ibuprofen (ADVIL,MOTRIN) 800 MG tablet Take 1 tablet (800 mg total) by mouth every 8 (eight) hours as needed for moderate pain or cramping. 04/15/16   Willodean Rosenthal, MD  tiZANidine (ZANAFLEX) 4 MG tablet 2 po QHS 12/05/15   [provider]  venlafaxine XR (EFFEXOR-XR) 75 MG 24 hr capsule  03/09/16   [provider]      Allergies    Patient has no known allergies.    Review of Systems   Review of Systems  Genitourinary:  Positive for vaginal discharge.     Physical Exam Updated Vital Signs BP (!) 114/49   Pulse 99   Temp 98.7 F (37.1 C) (Oral)   Resp 18   Ht 5\' 1"  (1.549 m)   Wt 60.8 kg   SpO2 95%   BMI 25.32 kg/m  Physical Exam Vitals and nursing note reviewed. Exam conducted with a chaperone present.  Constitutional:      General: She is not in acute distress.    Appearance: She is well-developed. She is not diaphoretic.  HENT:     Head: Normocephalic and atraumatic.     Right Ear: External ear normal.     Left Ear: External ear normal.     Nose: Nose normal.     Mouth/Throat:     Mouth: Mucous membranes are moist.  Eyes:     General: No scleral icterus.    Conjunctiva/sclera: Conjunctivae normal.  Cardiovascular:     Rate and Rhythm: Normal rate and regular rhythm.     Heart sounds: Normal heart sounds. No murmur heard.    No friction rub. No gallop.  Pulmonary:     Effort: Pulmonary effort is normal. No respiratory distress.     Breath sounds: Normal breath sounds.  Abdominal:     General: Bowel sounds are normal. There is no distension.     Palpations: Abdomen is soft. There is no mass.     Tenderness:  There is no abdominal tenderness. There is no guarding.  Genitourinary:    General: Normal vulva.     Vagina: Vaginal discharge and erythema present.     Cervix: Eversion present. No discharge, erythema or cervical bleeding.     Uterus: Enlarged.      Adnexa: Right adnexa normal and left adnexa normal.     Comments: Normal external female genitalia.  The vagina is well-rugated but erythematous with curd-like thin white discharge.  There is mucoid coating over the cervix with multiparous os, bluish hue and a mucoid plug centrally.  No obvious os dilation or fluid from the cervix. No pooling fluid in the vaginal canal.  No cervical tenderness.  Uterus is gravid.  Palpable fetal movement on examination. Musculoskeletal:     Cervical back: Normal range of motion.  Skin:    General: Skin is warm and dry.   Neurological:     Mental Status: She is alert and oriented to person, place, and time.  Psychiatric:        Behavior: Behavior normal.     ED Results / Procedures / Treatments   Labs (all labs ordered are listed, but only abnormal results are displayed) Labs Reviewed - No data to display  EKG None  Radiology No results found.  Procedures Procedures  1001.  Medications Ordered in ED Medications - No data to display  ED Course/ Medical Decision Making/ A&P                                 Medical Decision Making Amount and/or Complexity of Data Reviewed Labs: ordered.   Patient here with vaginal fluid, 24 weeks G2P1 Case discussed with Dr. Alvester Morin , on call L&D who asks that patient come for eval at the MAU. Wet prep without signs of infection. UA pending. CBG 83.  FHT 130-160-palpable fetal movement.  Patient is not having any vaginal bleeding active fluid from her vagina or cervix, contractions or pain.  She feels comfortable driving herself to the maternal assessment unit.HDS and safe for transfer.         Final Clinical Impression(s) / ED Diagnoses Final diagnoses:  None    Rx / DC Orders ED Discharge Orders     None         Arthor Captain, PA-C 08/26/23 1510    Laurence Spates, MD 08/28/23 1057

## 2023-08-26 NOTE — ED Notes (Signed)
PA C Harris at bedside of Pt. Assessing Pt. Internal and external exam.  Pt. Tolerating well.
